# Patient Record
Sex: Female | Born: 1990 | Race: White | Hispanic: No | Marital: Single | Smoking: Former smoker
Health system: Southern US, Community
[De-identification: ages and names within clinical notes are randomized; demographics above are authoritative.]

## PROBLEM LIST (undated history)

## (undated) DIAGNOSIS — Z789 Other specified health status: Secondary | ICD-10-CM

## (undated) HISTORY — PX: NO PAST SURGERIES: SHX2092

---
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
## 2013-10-28 ENCOUNTER — Ambulatory Visit: Payer: Self-pay | Admitting: Obstetrics and Gynecology

## 2013-12-29 ENCOUNTER — Inpatient Hospital Stay: Payer: Self-pay | Admitting: Obstetrics and Gynecology

## 2013-12-29 LAB — APTT: ACTIVATED PTT: 26.7 s (ref 23.6–35.9)

## 2013-12-29 LAB — CBC WITH DIFFERENTIAL/PLATELET
BASOS ABS: 1 %
HCT: 32.7 % — AB (ref 35.0–47.0)
HGB: 11 g/dL — ABNORMAL LOW (ref 12.0–16.0)
Lymphocytes: 24 %
MCH: 29.6 pg (ref 26.0–34.0)
MCHC: 33.5 g/dL (ref 32.0–36.0)
MCV: 88 fL (ref 80–100)
MONOS PCT: 10 %
PLATELETS: 196 10*3/uL (ref 150–440)
RBC: 3.71 10*6/uL — AB (ref 3.80–5.20)
RDW: 13.3 % (ref 11.5–14.5)
Segmented Neutrophils: 65 %
WBC: 13.7 10*3/uL — ABNORMAL HIGH (ref 3.6–11.0)

## 2013-12-29 LAB — PROTIME-INR
INR: 1
Prothrombin Time: 13.3 secs (ref 11.5–14.7)

## 2013-12-30 LAB — CBC WITH DIFFERENTIAL/PLATELET
BASOS ABS: 0 10*3/uL (ref 0.0–0.1)
Basophil %: 0.2 %
EOS ABS: 0.2 10*3/uL (ref 0.0–0.7)
Eosinophil %: 1.4 %
HCT: 31.8 % — ABNORMAL LOW (ref 35.0–47.0)
HGB: 10.7 g/dL — ABNORMAL LOW (ref 12.0–16.0)
Lymphocyte #: 2.5 10*3/uL (ref 1.0–3.6)
Lymphocyte %: 14.8 %
MCH: 29.7 pg (ref 26.0–34.0)
MCHC: 33.6 g/dL (ref 32.0–36.0)
MCV: 88 fL (ref 80–100)
Monocyte #: 1.4 x10 3/mm — ABNORMAL HIGH (ref 0.2–0.9)
Monocyte %: 8.4 %
NEUTROS PCT: 75.2 %
Neutrophil #: 12.5 10*3/uL — ABNORMAL HIGH (ref 1.4–6.5)
Platelet: 201 10*3/uL (ref 150–440)
RBC: 3.6 10*6/uL — AB (ref 3.80–5.20)
RDW: 13.7 % (ref 11.5–14.5)
WBC: 16.6 10*3/uL — ABNORMAL HIGH (ref 3.6–11.0)

## 2014-01-01 LAB — HEMOGLOBIN: HGB: 11.4 g/dL — AB (ref 12.0–16.0)

## 2014-01-05 LAB — MISC AER/ANAEROBIC CULT.

## 2014-01-06 LAB — PATHOLOGY REPORT

## 2014-09-06 NOTE — H&P (Signed)
L&D Evaluation:  History:  HPI 24 yo G2P1 with IUFD at 29 weeks . CNM Jones diagnosed yesterday. + hydrops noted on u/s . BT A+. Declined first and second trimester testing . RPR and glucola nl yesterday . One prior term delivery with the same FOB   Patient's Medical History No Chronic Illness   Patient's Surgical History none   Medications Pre Natal Vitamins  Iron   Allergies NKDA   Social History none   Family History Non-Contributory   ROS:  ROS All systems were reviewed.  HEENT, CNS, GI, GU, Respiratory, CV, Renal and Musculoskeletal systems were found to be normal.   Exam:  Vital Signs stable   General no apparent distress   Mental Status clear   Chest clear   Heart normal sinus rhythm   Abdomen gravid, non-tender   Back no CVAT   Edema no edema   Pelvic 1 cm  by RN   Mebranes Intact   Skin dry   Impression:  Impression 29 week IUFD, etiology uncertain currently   Plan:  Plan IUFD work up , Induction start with cervidil , . Talk to couple about possibility for genetic w/up , autopsy . Un decided at this time . All questions answered . \IV demerol + zofran prn . Ambien tonight . CLE early in labor   Electronic Signatures: Schermerhorn, Ihor Austinhomas J (MD)  (Signed 02-Sep-15 23:17)  Authored: L&D Evaluation   Last Updated: 02-Sep-15 23:17 by Schermerhorn, Ihor Austinhomas J (MD)

## 2017-04-29 NOTE — L&D Delivery Note (Signed)
Delivery Note At 7:03 PM a viable and healthy female "Gabriela Pope" was delivered via Vaginal, Spontaneous (Presentation: LOA ).  APGAR:7 , 9 weight 3470g .   Placenta status: , .  Cord:  with the following complications: .  Cord pH: pending  Anesthesia:  epidural Episiotomy:  none Lacerations:  none Suture Repair: n/a Est. Blood Loss (mL):  200  Mom to postpartum.  Baby to Couplet care / Skin to Skin.  27yo [id number] at 39+0wks with hx of unexplained IUFD at 29wks presented for iol at term. She was given cytotex x2, a Cook cath and started on pitocin. AROM for meconium stained fluid, and she progressed on pitocin to fully dilated. She pushed twice over an intact perineum and delivered a fetal head that was limited by a tight nuchal cord that was also wrapped under the right axilla. The cord was doubly clamped and cut and the fetal shoulders were delivered with McRoberts. The baby was placed on maternal abdomen and stimulated for 30 sec, then transferred to the warmer and stimulated by SCN staff. The placenta delivered spontaneously intact with postpartum pitocin running. Bleeding was minimal. Placenta was intact. The fundus was firm. She tolerated the whole procedure well, watching her progress in a mirror. The baby was placed on the maternal chest for skin-to-skin and she plans on breastfeeding.  Gabriela Pope 12/04/2017, 7:25 PM

## 2017-05-14 ENCOUNTER — Other Ambulatory Visit: Payer: Self-pay | Admitting: Obstetrics and Gynecology

## 2017-05-14 DIAGNOSIS — Z369 Encounter for antenatal screening, unspecified: Secondary | ICD-10-CM

## 2017-06-02 ENCOUNTER — Ambulatory Visit (HOSPITAL_BASED_OUTPATIENT_CLINIC_OR_DEPARTMENT_OTHER)
Admission: RE | Admit: 2017-06-02 | Discharge: 2017-06-02 | Disposition: A | Discharge status: Home / Self Care | Payer: BLUE CROSS/BLUE SHIELD | Source: Ambulatory Visit | Attending: Obstetrics and Gynecology | Admitting: Obstetrics and Gynecology

## 2017-06-02 ENCOUNTER — Ambulatory Visit
Admission: RE | Admit: 2017-06-02 | Discharge: 2017-06-02 | Disposition: A | Discharge status: Home / Self Care | Payer: BLUE CROSS/BLUE SHIELD | Source: Ambulatory Visit | Attending: Obstetrics and Gynecology | Admitting: Obstetrics and Gynecology

## 2017-06-02 VITALS — BP 108/61 | HR 103 | Temp 98.2°F | Resp 18 | Ht 65.0 in | Wt 190.6 lb

## 2017-06-02 DIAGNOSIS — Z369 Encounter for antenatal screening, unspecified: Secondary | ICD-10-CM

## 2017-06-02 DIAGNOSIS — Z3A12 12 weeks gestation of pregnancy: Secondary | ICD-10-CM | POA: Diagnosis not present

## 2017-06-02 DIAGNOSIS — O09291 Supervision of pregnancy with other poor reproductive or obstetric history, first trimester: Secondary | ICD-10-CM | POA: Insufficient documentation

## 2017-06-02 HISTORY — DX: Other specified health status: Z78.9

## 2017-06-02 NOTE — Progress Notes (Addendum)
Referring physician:  [facility] Length of Consultation: 45 minutes   Gabriela Pope  was referred to [facility] [place] for genetic counseling to review prenatal screening and testing options.  We also reviewed her family history and pregnancy history which includes a 29 week fetal demise in 2015.  This note summarizes the information we discussed.    We first obtained a detailed pregnancy history and family history.  This is the third pregnancy for Gabriela Pope, the first with her current partner, Rosalyn Gess.  She has a healthy 37 year old daughter and he has a healthy 87 year old daughter.  Her second pregnancy resulted in a fetal demise at 29 weeks.  She reports that during that pregnancy, there was very little fetal movement.  A couple of weeks before the loss, she had a viral illness for several days which included a high heart rate for herself.  On the day she learned of the loss, she went to the doctor with no fetal movement and a high heart rate.  Ultrasound showed no fetal heart beat, polyhydramnios, skin edema and fluid in the chest and abdomen of the fetus. She delivered and declined an autopsy.  Chromosomal microarray was performed which showed normal female results.  No serology was performed to assess for infectious causes.  Placental pathology showed edematous villi, nucleated rbcs, 3 vessel cord and was negative for viral cytopathic effect, funicity and chorioamnionitis.  We discussed that hydrops can happen for many reasons including a chromosome condition, structural heart defect, genetic syndrome, infections and many other causes.  Because we have no clear etiology for that pregnancy, it is difficult to provide an accurate recurrence risk estimate.  Dr. Quin Hoop recommended initiation of antenatal testing at 28 weeks with monthly ultrasounds for growth and delivery by [facility].  In the current pregnancy, she reported no complications or exposures that would be expected  to increase the risk for birth defects.  In the family history, Rosalyn Gess reported that his sister has a son with severe cerebral palsy.  He reported that the child was born and experienced a seizure during which he was without oxygen.  He was transferred at that time to [facility] where the family was told he would have cerebral palsy due to the lack of oxygen.   If his condition is related to the lack of oxygen, then we would not expect other family members to be at increased risk for a similar outcome.  However, without knowing the cause for the seizures or the possibility of an underlying genetic condition, we cannot rule out an inherited component completely.  Alishba reported one paternal first cousin with Down syndrome.  He is currently in his 27s and lives in a group home.  We reviewed that Down syndrome is caused by an extra copy of the genetic instructions located on chromosome number 21.  These extra instructions result in the characteristic facial appearance, intellectual disabilities and an increased risk for some types of birth defects.  The majority (95%) of persons with Down syndrome have three freestanding copies of chromosome number 21, called trisomy 55.  This type of Down syndrome occurs as a sporadic condition and does not increase the chance for other family members to have Down syndrome.  Rarely, Down syndrome is caused by a rearrangement of the genetic instructions, where the extra chromosome number 21 is attached to another chromosome.  This type of chromosome rearrangement can be passed down through families and therefore may increase  the chance for Down syndrome in other family members.  We cannot determine which type of Down syndrome is present without documentation of chromosome studies in the affected family member.  If any additional information is obtained about this history, please do not hesitate to contact us.  The remainder of the family history was reported to be unremarkable for birth  defects, intellectual delays, recurrent pregnancy loss or known chromosome abnormalities.  We offered the following routine screening tests for this pregnancy:  First trimester screening, which includes nuchal translucency ultrasound screen and first trimester maternal serum marker screening.  The nuchal translucency has approximately an 80% detection rate for Down syndrome and can be positive for other chromosome abnormalities as well as congenital heart defects.  When combined with a maternal serum marker screening, the detection rate is up to 90% for Down syndrome and up to 97% for trisomy 18.     Maternal serum marker screening, a blood test that measures pregnancy proteins, can provide risk assessments for Down syndrome, trisomy 18, and open neural tube defects (spina bifida, anencephaly). Because it does not directly examine the fetus, it cannot positively diagnose or rule out these problems.  Targeted ultrasound uses high frequency sound waves to create an image of the developing fetus.  An ultrasound is often recommended as a routine means of evaluating the pregnancy.  It is also used to screen for fetal anatomy problems (for example, a heart defect) that might be suggestive of a chromosomal or other abnormality.   Should these screening tests indicate an increased concern, then the following additional testing options would be offered:  The chorionic villus sampling procedure is available for first trimester chromosome analysis.  This involves the withdrawal of a small amount of chorionic villi (tissue from the developing placenta).  Risk of pregnancy loss is estimated to be approximately 1 in 200 to 1 in 100 (0.5 to 1%).  There is approximately a 1% (1 in 100) chance that the CVS chromosome results will be unclear.  Chorionic villi cannot be tested for neural tube defects.     Amniocentesis involves the removal of a small amount of amniotic fluid from the sac surrounding the fetus with the  use of a thin needle inserted through the maternal abdomen and uterus.  Ultrasound guidance is used throughout the procedure.  Fetal cells from amniotic fluid are directly evaluated and > 99.5% of chromosome problems and > 98% of open neural tube defects can be detected. This procedure is generally performed after the 15th week of pregnancy.  The main risks to this procedure include complications leading to miscarriage in less than 1 in 200 cases (0.5%).  As another option for information if the pregnancy is suspected to be an an increased chance for certain chromosome conditions, we also reviewed the availability of cell free fetal DNA testing from maternal blood to determine whether or not the baby may have either Down syndrome, trisomy 513, or trisomy 4018.  This test utilizes a maternal blood sample and DNA sequencing technology to isolate circulating cell free fetal DNA from maternal plasma.  The fetal DNA can then be analyzed for DNA sequences that are derived from the three most common chromosomes involved in aneuploidy, chromosomes 13, 18, and 21.  If the overall amount of DNA is greater than the expected level for any of these chromosomes, aneuploidy is suspected.  While we do not consider it a replacement for invasive testing and karyotype analysis, a negative result from this testing  would be reassuring, though not a guarantee of a normal chromosome complement for the baby.  An abnormal result is certainly suggestive of an abnormal chromosome complement, though we would still recommend CVS or amniocentesis to confirm any findings from this testing.  Cystic Fibrosis and Spinal Muscular Atrophy (SMA) screening were also discussed with the patient. Both conditions are recessive, which means that both parents must be carriers in order to have a child with the disease.  Cystic fibrosis (CF) is one of the most common genetic conditions in persons of Caucasian ancestry.  This condition occurs in approximately 1  in 2,500 Caucasian persons and results in thickened secretions in the lungs, digestive, and reproductive systems.  For a baby to be at risk for having CF, both of the parents must be carriers for this condition.  Approximately 1 in 29 Caucasian persons is a carrier for CF.  Current carrier testing looks for the most common mutations in the gene for CF and can detect approximately 90% of carriers in the Caucasian population.  This means that the carrier screening can greatly reduce, but cannot eliminate, the chance for an individual to have a child with CF.  If an individual is found to be a carrier for CF, then carrier testing would be available for the partner. As part of [place]'s newborn screening profile, all babies born in the state of [place] will have a two-tier screening process.  Specimens are first tested to determine the concentration of immunoreactive trypsinogen (IRT).  The top 5% of specimens with the highest IRT values then undergo DNA testing using a panel of over 40 common CF mutations. SMA is a neurodegenerative disorder that leads to atrophy of skeletal muscle and overall weakness.  This condition is also more prevalent in the Caucasian population, with 1 in 40-1 in 60 persons being a carrier and 1 in 6,000-1 in 10,000 children being affected.  There are multiple forms of the disease, with some causing death in infancy to other forms with survival into adulthood.  The genetics of SMA is complex, but carrier screening can detect up to 95% of carriers in the Caucasian population.  Similar to CF, a negative result can greatly reduce, but cannot eliminate, the chance to have a child with SMA.  After consideration of the options, Ms. Rivere elected to proceed with an ultrasound only and to decline all other screening and testing options.  An ultrasound was performed at the time of the visit.  The gestational age was consistent with 12 weeks.  Fetal anatomy could not be assessed due to  early gestational age.  Please refer to the ultrasound report for details of that study.  Ms. Nichter will follow up as needed with her OB.  Ms. Austria was encouraged to call with questions or concerns.  We can be contacted at [phone].  Tests ordered: none   Cherly Anderson, MS, CGC  Katrina Stack, MS, CGC performed an integral service incident to the physician's initial service.  I was physically present in the clinical area and was immediately available to render assistance.   Donya Tomaro C Gordon Vandunk

## 2017-07-05 ENCOUNTER — Ambulatory Visit
Admission: EM | Admit: 2017-07-05 | Discharge: 2017-07-05 | Disposition: A | Discharge status: Home / Self Care | Payer: BLUE CROSS/BLUE SHIELD | Attending: Family Medicine | Admitting: Family Medicine

## 2017-07-05 ENCOUNTER — Other Ambulatory Visit: Payer: Self-pay

## 2017-07-05 DIAGNOSIS — R05 Cough: Secondary | ICD-10-CM | POA: Diagnosis not present

## 2017-07-05 DIAGNOSIS — R0981 Nasal congestion: Secondary | ICD-10-CM

## 2017-07-05 DIAGNOSIS — Z331 Pregnant state, incidental: Secondary | ICD-10-CM

## 2017-07-05 DIAGNOSIS — R059 Cough, unspecified: Secondary | ICD-10-CM

## 2017-07-05 NOTE — ED Triage Notes (Signed)
Pt reports she had flu like sx last week and most sx have resolved. Now with residual congestion in sinuses and chest. Blowing bloody secretions from nose. Pain 0/10

## 2017-07-05 NOTE — Discharge Instructions (Signed)
Rest.  Fluids.  Supportive care.  Take care  Dr. Adriana Simasook

## 2017-07-05 NOTE — ED Provider Notes (Signed)
[facility]    CSN: [id number] Arrival date & time: 07/05/17  1309  History   Chief Complaint Chief Complaint  Patient presents with  . Nasal Congestion   HPI  27 year old female presents with chest congestion and cough.  She also reports some nasal congestion as well.  Patient is currently pregnant.  She is approximately [redacted] weeks pregnant.  She has recently had the flu as has her daughter.  She states that her fever and other influenza symptoms have resolved.  She is now bothered predominantly by chest congestion and cough.  She states that she has been getting "winded" easily.  States that she wanted to get evaluated because of her ongoing symptoms.  No further fever.  No improvement in her symptoms with Sudafed.  No other interventions tried.  No other complaints or concerns at this time.  Past Medical History:  Diagnosis Date  . Medical history non-contributory    Patient Active Problem List   Diagnosis Date Noted  . First trimester screening   . Prior pregnancy with fetal demise and current pregnancy in first trimester    Surgical Hx - No past surgeries.  OB History    Gravida Para Term Preterm AB Living   3         1   SAB TAB Ectopic Multiple Live Births                 Home Medications    Prior to Admission medications   Medication Sig Start Date End Date Taking? Authorizing Provider  pseudoephedrine (SUDAFED) 120 MG 12 hr tablet Take 120 mg by mouth 2 (two) times daily.   Yes [provider]  flintstones complete (FLINTSTONES) 60 MG chewable tablet Chew 1 tablet by mouth daily.    [provider]   Family History Family History  Problem Relation Age of Onset  . Heart disease Father    Social History Social History   Tobacco Use  . Smoking status: Never Smoker  . Smokeless tobacco: Never Used  Substance Use Topics  . Alcohol use: No    Frequency: Never  . Drug use: No   Allergies   Patient has no known  allergies.   Review of Systems Review of Systems  Constitutional: Negative for fever.  HENT: Positive for congestion.   Respiratory: Positive for cough and shortness of breath.    Physical Exam Triage Vital Signs ED Triage Vitals  Enc Vitals Group     BP 07/05/17 1341 117/71     Pulse Rate 07/05/17 1341 91     Resp 07/05/17 1341 18     Temp 07/05/17 1341 98 F (36.7 C)     Temp Source 07/05/17 1341 Oral     SpO2 07/05/17 1341 100 %     Weight 07/05/17 1342 187 lb (84.8 kg)     Height 07/05/17 1342 5\' 5"  (1.651 m)     Head Circumference --      Peak Flow --      Pain Score --      Pain Loc --      Pain Edu? --      Excl. in GC? --    Updated Vital Signs BP 117/71 (BP Location: Left Arm)   Pulse 91   Temp 98 F (36.7 C) (Oral)   Resp 18   Ht 5\' 5"  (1.651 m)   Wt 187 lb (84.8 kg)   LMP 02/26/2017   SpO2 100%   BMI  31.12 kg/m     Physical Exam  Constitutional: She is oriented to person, place, and time. She appears well-developed. No distress.  HENT:  Head: Normocephalic and atraumatic.  Mouth/Throat: Oropharynx is clear and moist.  Eyes: Conjunctivae are normal.  Cardiovascular: Normal rate and regular rhythm.  Pulmonary/Chest: Effort normal and breath sounds normal. She has no wheezes. She has no rales.  Neurological: She is alert and oriented to person, place, and time.  Psychiatric: She has a normal mood and affect. Her behavior is normal.  Nursing note and vitals reviewed.  UC Treatments / Results  Labs (all labs ordered are listed, but only abnormal results are displayed) Labs Reviewed - No data to display  EKG  EKG Interpretation None       Radiology No results found.  Procedures Procedures (including critical care time)  Medications Ordered in UC Medications - No data to display   Initial Impression / Assessment and Plan / UC Course  I have reviewed the triage vital signs and the nursing notes.  Pertinent labs & imaging results that  were available during my care of the patient were reviewed by me and considered in my medical decision making (see chart for details).     28 year old female presents with respiratory symptoms following recent influenza.  Her exam is unremarkable.  I do not see an indication for further intervention at this time.  No antibiotic or other medication needed especially in the setting of pregnancy.  Advised supportive care with over-the-counter medication per her OB/GYN.  Final Clinical Impressions(s) / UC Diagnoses   Final diagnoses:  Cough    ED Discharge Orders    None     Controlled Substance Prescriptions [place] Controlled Substance Registry consulted? Not Applicable   Tommie Sams, DO 07/05/17 1442

## 2017-08-18 LAB — OB RESULTS CONSOLE ABO/RH: RH Type: POSITIVE

## 2017-08-18 LAB — OB RESULTS CONSOLE HIV ANTIBODY (ROUTINE TESTING)
HIV: NONREACTIVE
HIV: NONREACTIVE

## 2017-08-18 LAB — OB RESULTS CONSOLE ANTIBODY SCREEN: ANTIBODY SCREEN: NEGATIVE

## 2017-08-18 LAB — OB RESULTS CONSOLE RUBELLA ANTIBODY, IGM: RUBELLA: IMMUNE

## 2017-08-18 LAB — OB RESULTS CONSOLE RPR: RPR: NONREACTIVE

## 2017-11-20 ENCOUNTER — Other Ambulatory Visit: Payer: Self-pay | Admitting: Obstetrics and Gynecology

## 2017-11-20 ENCOUNTER — Observation Stay
Admission: EM | Admit: 2017-11-20 | Discharge: 2017-11-21 | Disposition: A | Discharge status: Home / Self Care | Payer: Medicaid Other | Attending: Obstetrics and Gynecology | Admitting: Obstetrics and Gynecology

## 2017-11-20 DIAGNOSIS — Z3A37 37 weeks gestation of pregnancy: Secondary | ICD-10-CM | POA: Insufficient documentation

## 2017-11-20 DIAGNOSIS — Z3483 Encounter for supervision of other normal pregnancy, third trimester: Principal | ICD-10-CM | POA: Insufficient documentation

## 2017-11-20 DIAGNOSIS — Z3490 Encounter for supervision of normal pregnancy, unspecified, unspecified trimester: Secondary | ICD-10-CM

## 2017-11-20 NOTE — Progress Notes (Unsigned)
Orders placed for IOL for 12/04/17. Myrtie Cruisearon W. Romey Mathieson,RN, MSN, CNM, FNP Certified Nurse Midwife [facility]

## 2017-11-20 NOTE — OB Triage Note (Signed)
Pt states she is here due to leaking small amounts of colorless, odorless fluid since 4AM this morning. Pt states the fluid is just enough to get her panties wet. Pt denies vaginal bleeding. Pt states she is having irregular ctxs. Pt states + fetal movement.

## 2017-11-21 ENCOUNTER — Encounter: Payer: Self-pay | Admitting: Certified Nurse Midwife

## 2017-11-21 DIAGNOSIS — Z3A37 37 weeks gestation of pregnancy: Secondary | ICD-10-CM | POA: Diagnosis not present

## 2017-11-21 DIAGNOSIS — Z3483 Encounter for supervision of other normal pregnancy, third trimester: Secondary | ICD-10-CM | POA: Diagnosis present

## 2017-11-21 DIAGNOSIS — Z3490 Encounter for supervision of normal pregnancy, unspecified, unspecified trimester: Secondary | ICD-10-CM

## 2017-11-21 LAB — ROM PLUS (ARMC ONLY): Rom Plus: NEGATIVE

## 2017-11-21 NOTE — Discharge Summary (Signed)
TRIAGE VISIT with NST  CC: Leaking fluid in pregnancy   Gabriela MadeiraKasey A Pope is a 27 y.o. G3P0. She is at 3728w2d gestation.  S: Resting comfortably. no CTX, no VB. Active fetal movement. Concerned about leaking   O:  BP 102/74 (BP Location: Right Arm)   Pulse (!) 113   Temp 98.4 F (36.9 C) (Oral)   Resp 18   Ht 5\' 5"  (1.651 m)   Wt 96.6 kg (213 lb)   LMP 02/26/2017   BMI 35.45 kg/m  Results for orders placed or performed during the hospital encounter of 11/20/17 (from the past 48 hour(s))  ROM Plus ([facility] only)   Collection Time: 11/21/17 12:26 AM  Result Value Ref Range   Rom Plus NEGATIVE      Gen: NAD, AAOx3      Abd: FNTTP      Ext: Non-tender, Nonedmeatous    FHT: 155, mod var, +accels, no decels TOCO: quiet SVE: Dilation: 1 Effacement (%): Thick Station: -3 Exam by:: Dione PloverA Jones, RN   A/P:  27 y.o. G3P0 at 2528w2d with R/o ROM.   Labor: not present.   R/o ROM: SSE negative x 3. ROM-plus negative. Normal amount of white discharge.   FWB: NST is Reassuring Cat 1 tracing.  D/c home stable, precautions reviewed, follow-up as scheduled.

## 2017-11-21 NOTE — OB Triage Note (Signed)
Discharge instructions provided and reviewed.  Follow up care discussed.  Pt verbalized understanding. 

## 2017-12-04 ENCOUNTER — Inpatient Hospital Stay
Admission: AD | Admit: 2017-12-04 | Discharge: 2017-12-06 | DRG: 807 | Disposition: A | Discharge status: Home / Self Care | Payer: Medicaid Other | Attending: Obstetrics and Gynecology | Admitting: Obstetrics and Gynecology

## 2017-12-04 ENCOUNTER — Other Ambulatory Visit: Payer: Self-pay

## 2017-12-04 ENCOUNTER — Inpatient Hospital Stay: Payer: Medicaid Other | Admitting: Anesthesiology

## 2017-12-04 DIAGNOSIS — Z87891 Personal history of nicotine dependence: Secondary | ICD-10-CM | POA: Diagnosis not present

## 2017-12-04 DIAGNOSIS — Z3A39 39 weeks gestation of pregnancy: Secondary | ICD-10-CM | POA: Diagnosis not present

## 2017-12-04 DIAGNOSIS — O26893 Other specified pregnancy related conditions, third trimester: Secondary | ICD-10-CM | POA: Diagnosis present

## 2017-12-04 LAB — COMPREHENSIVE METABOLIC PANEL
ALBUMIN: 3.1 g/dL — AB (ref 3.5–5.0)
ALK PHOS: 168 U/L — AB (ref 38–126)
ALT: 12 U/L (ref 0–44)
AST: 26 U/L (ref 15–41)
Anion gap: 10 (ref 5–15)
BILIRUBIN TOTAL: 0.5 mg/dL (ref 0.3–1.2)
BUN: 9 mg/dL (ref 6–20)
CALCIUM: 9 mg/dL (ref 8.9–10.3)
CO2: 20 mmol/L — ABNORMAL LOW (ref 22–32)
Chloride: 108 mmol/L (ref 98–111)
Creatinine, Ser: 0.74 mg/dL (ref 0.44–1.00)
GFR calc Af Amer: >60 mL/min (ref >60)
GFR calc non Af Amer: >60 mL/min (ref >60)
GLUCOSE: 109 mg/dL — AB (ref 70–99)
Potassium: 3.5 mmol/L (ref 3.5–5.1)
Sodium: 138 mmol/L (ref 135–145)
TOTAL PROTEIN: 6.3 g/dL — AB (ref 6.5–8.1)

## 2017-12-04 LAB — CBC
HEMATOCRIT: 34.2 % — AB (ref 35.0–47.0)
HEMOGLOBIN: 11.7 g/dL — AB (ref 12.0–16.0)
MCH: 29.8 pg (ref 26.0–34.0)
MCHC: 34.2 g/dL (ref 32.0–36.0)
MCV: 87 fL (ref 80.0–100.0)
Platelets: 219 10*3/uL (ref 150–440)
RBC: 3.93 MIL/uL (ref 3.80–5.20)
RDW: 13.8 % (ref 11.5–14.5)
WBC: 14.3 10*3/uL — AB (ref 3.6–11.0)

## 2017-12-04 MED ORDER — BENZOCAINE-MENTHOL 20-0.5 % EX AERO: 1.0000 "application " | INHALATION_SPRAY | CUTANEOUS | Status: DC | PRN

## 2017-12-04 MED ORDER — ONDANSETRON HCL 4 MG PO TABS: 4.0000 mg | ORAL_TABLET | ORAL | Status: DC | PRN

## 2017-12-04 MED ORDER — MISOPROSTOL 200 MCG PO TABS
ORAL_TABLET | ORAL | Status: AC
  Filled 2017-12-04: qty 4

## 2017-12-04 MED ORDER — BUTORPHANOL TARTRATE 2 MG/ML IJ SOLN: 1.0000 mg | INTRAMUSCULAR | Status: DC | PRN

## 2017-12-04 MED ORDER — TERBUTALINE SULFATE 1 MG/ML IJ SOLN: 0.2500 mg | Freq: Once | INTRAMUSCULAR | Status: DC | PRN

## 2017-12-04 MED ORDER — AMMONIA AROMATIC IN INHA
RESPIRATORY_TRACT | Status: AC
  Filled 2017-12-04: qty 10

## 2017-12-04 MED ORDER — FENTANYL 2.5 MCG/ML W/ROPIVACAINE 0.15% IN NS 100 ML EPIDURAL (ARMC)
EPIDURAL | Status: DC | PRN
  Administered 2017-12-04: 12 mL/h via EPIDURAL

## 2017-12-04 MED ORDER — IBUPROFEN 600 MG PO TABS
600.0000 mg | ORAL_TABLET | Freq: Four times a day (QID) | ORAL | Status: DC
  Administered 2017-12-04 – 2017-12-06 (×7): 600 mg via ORAL
  Filled 2017-12-04 (×7): qty 1

## 2017-12-04 MED ORDER — SODIUM CHLORIDE 0.9 % IV SOLN: 2.0000 g | Freq: Once | INTRAVENOUS | Status: DC

## 2017-12-04 MED ORDER — OXYTOCIN 40 UNITS IN LACTATED RINGERS INFUSION - SIMPLE MED
1.0000 m[IU]/min | INTRAVENOUS | Status: DC
  Administered 2017-12-04: 2 m[IU]/min via INTRAVENOUS

## 2017-12-04 MED ORDER — OXYTOCIN 10 UNIT/ML IJ SOLN
INTRAMUSCULAR | Status: AC
  Filled 2017-12-04: qty 2

## 2017-12-04 MED ORDER — LIDOCAINE HCL (PF) 1 % IJ SOLN
INTRAMUSCULAR | Status: DC | PRN
  Administered 2017-12-04: 3 mL via SUBCUTANEOUS

## 2017-12-04 MED ORDER — ONDANSETRON HCL 4 MG/2ML IJ SOLN: 4.0000 mg | Freq: Four times a day (QID) | INTRAMUSCULAR | Status: DC | PRN

## 2017-12-04 MED ORDER — WITCH HAZEL-GLYCERIN EX PADS: 1.0000 "application " | MEDICATED_PAD | CUTANEOUS | Status: DC | PRN

## 2017-12-04 MED ORDER — LACTATED RINGERS IV BOLUS
1000.0000 mL | Freq: Once | INTRAVENOUS | Status: AC
  Administered 2017-12-04: 500 mL via INTRAVENOUS

## 2017-12-04 MED ORDER — SODIUM CHLORIDE 0.9% FLUSH: 3.0000 mL | INTRAVENOUS | Status: DC | PRN

## 2017-12-04 MED ORDER — COCONUT OIL OIL
1.0000 "application " | TOPICAL_OIL | Status: DC | PRN
  Administered 2017-12-05: 1 via TOPICAL
  Filled 2017-12-04: qty 120

## 2017-12-04 MED ORDER — ONDANSETRON HCL 4 MG PO TABS: 4.0000 mg | ORAL_TABLET | Freq: Four times a day (QID) | ORAL | Status: DC | PRN

## 2017-12-04 MED ORDER — OXYCODONE HCL 5 MG PO TABS: 5.0000 mg | ORAL_TABLET | ORAL | Status: DC | PRN

## 2017-12-04 MED ORDER — BUPIVACAINE HCL (PF) 0.25 % IJ SOLN
INTRAMUSCULAR | Status: DC | PRN
  Administered 2017-12-04 (×2): 4 mL via EPIDURAL

## 2017-12-04 MED ORDER — FLEET ENEMA 7-19 GM/118ML RE ENEM: 1.0000 | ENEMA | Freq: Every day | RECTAL | Status: DC | PRN

## 2017-12-04 MED ORDER — DIPHENHYDRAMINE HCL 25 MG PO CAPS: 25.0000 mg | ORAL_CAPSULE | Freq: Four times a day (QID) | ORAL | Status: DC | PRN

## 2017-12-04 MED ORDER — MEASLES, MUMPS & RUBELLA VAC ~~LOC~~ INJ: 0.5000 mL | INJECTION | Freq: Once | SUBCUTANEOUS | Status: DC

## 2017-12-04 MED ORDER — ONDANSETRON HCL 4 MG/2ML IJ SOLN: 4.0000 mg | INTRAMUSCULAR | Status: DC | PRN

## 2017-12-04 MED ORDER — ZOLPIDEM TARTRATE 5 MG PO TABS
5.0000 mg | ORAL_TABLET | Freq: Every evening | ORAL | Status: DC | PRN
  Administered 2017-12-04: 5 mg via ORAL
  Filled 2017-12-04: qty 1

## 2017-12-04 MED ORDER — OXYTOCIN 40 UNITS IN LACTATED RINGERS INFUSION - SIMPLE MED
2.5000 [IU]/h | INTRAVENOUS | Status: DC
  Administered 2017-12-04: 2.5 [IU]/h via INTRAVENOUS

## 2017-12-04 MED ORDER — BISACODYL 10 MG RE SUPP: 10.0000 mg | Freq: Every day | RECTAL | Status: DC | PRN

## 2017-12-04 MED ORDER — MISOPROSTOL 25 MCG QUARTER TABLET
ORAL_TABLET | ORAL | Status: AC
  Administered 2017-12-04: 25 ug via VAGINAL
  Filled 2017-12-04: qty 2

## 2017-12-04 MED ORDER — ZOLPIDEM TARTRATE 5 MG PO TABS: 5.0000 mg | ORAL_TABLET | Freq: Every evening | ORAL | Status: DC | PRN

## 2017-12-04 MED ORDER — MISOPROSTOL 25 MCG QUARTER TABLET
25.0000 ug | ORAL_TABLET | ORAL | Status: DC
  Administered 2017-12-04: 25 ug via VAGINAL
  Filled 2017-12-04: qty 1

## 2017-12-04 MED ORDER — FENTANYL 2.5 MCG/ML W/ROPIVACAINE 0.15% IN NS 100 ML EPIDURAL (ARMC)
EPIDURAL | Status: AC
  Filled 2017-12-04: qty 100

## 2017-12-04 MED ORDER — IBUPROFEN 600 MG PO TABS
ORAL_TABLET | ORAL | Status: AC
  Filled 2017-12-04: qty 1

## 2017-12-04 MED ORDER — LACTATED RINGERS IV SOLN
INTRAVENOUS | Status: DC
  Administered 2017-12-04 (×2): via INTRAVENOUS

## 2017-12-04 MED ORDER — ACETAMINOPHEN 325 MG PO TABS
650.0000 mg | ORAL_TABLET | ORAL | Status: DC | PRN
  Administered 2017-12-05 – 2017-12-06 (×4): 650 mg via ORAL
  Filled 2017-12-04 (×4): qty 2

## 2017-12-04 MED ORDER — SENNOSIDES-DOCUSATE SODIUM 8.6-50 MG PO TABS
2.0000 | ORAL_TABLET | ORAL | Status: DC
  Administered 2017-12-05: 2 via ORAL
  Filled 2017-12-04 (×2): qty 2

## 2017-12-04 MED ORDER — SODIUM CHLORIDE 0.9 % IV SOLN: 250.0000 mL | INTRAVENOUS | Status: DC | PRN

## 2017-12-04 MED ORDER — OXYTOCIN 40 UNITS IN LACTATED RINGERS INFUSION - SIMPLE MED
INTRAVENOUS | Status: AC
  Administered 2017-12-04: 2 m[IU]/min via INTRAVENOUS
  Filled 2017-12-04: qty 1000

## 2017-12-04 MED ORDER — SODIUM CHLORIDE 0.9% FLUSH: 3.0000 mL | Freq: Two times a day (BID) | INTRAVENOUS | Status: DC

## 2017-12-04 MED ORDER — SIMETHICONE 80 MG PO CHEW
80.0000 mg | CHEWABLE_TABLET | ORAL | Status: DC | PRN
Start: 2017-12-04 — End: 2017-12-06

## 2017-12-04 MED ORDER — OXYTOCIN 40 UNITS IN LACTATED RINGERS INFUSION - SIMPLE MED
500.0000 m[IU]/h | INTRAVENOUS | Status: AC
  Administered 2017-12-04: 500 m[IU]/h via INTRAVENOUS

## 2017-12-04 MED ORDER — OXYTOCIN 40 UNITS IN LACTATED RINGERS INFUSION - SIMPLE MED: 2.5000 [IU]/h | INTRAVENOUS | Status: DC

## 2017-12-04 MED ORDER — MISOPROSTOL 25 MCG QUARTER TABLET
25.0000 ug | ORAL_TABLET | ORAL | Status: DC
  Administered 2017-12-04 (×2): 25 ug via ORAL
  Filled 2017-12-04: qty 1

## 2017-12-04 MED ORDER — LIDOCAINE HCL (PF) 1 % IJ SOLN
INTRAMUSCULAR | Status: AC
  Filled 2017-12-04: qty 30

## 2017-12-04 MED ORDER — DIBUCAINE 1 % RE OINT: 1.0000 "application " | TOPICAL_OINTMENT | RECTAL | Status: DC | PRN

## 2017-12-04 MED ORDER — TETANUS-DIPHTH-ACELL PERTUSSIS 5-2.5-18.5 LF-MCG/0.5 IM SUSP: 0.5000 mL | Freq: Once | INTRAMUSCULAR | Status: DC

## 2017-12-04 MED ORDER — MISOPROSTOL 25 MCG QUARTER TABLET
25.0000 ug | ORAL_TABLET | ORAL | Status: DC | PRN
  Administered 2017-12-04: 25 ug via VAGINAL

## 2017-12-04 MED ORDER — PRENATAL MULTIVITAMIN CH
1.0000 | ORAL_TABLET | Freq: Every day | ORAL | Status: DC
  Administered 2017-12-05: 1 via ORAL
  Filled 2017-12-04: qty 1

## 2017-12-04 NOTE — Progress Notes (Signed)
Gabriela Pope is a 27 y.o. G3P1101 at 455w1d  admitted for induction of labor due to prior IUFD.  Subjective: Comfortable with epidural  Objective: BP 115/86   Pulse 96   Temp 98.7 F (37.1 C) (Oral)   Resp 16   Ht 5\' 5"  (1.651 m)   Wt 96.6 kg   LMP 02/26/2017   SpO2 97%   BMI 35.45 kg/m  No intake/output data recorded. Total I/O In: 2270.8 [I.V.:1770.8; IV Piggyback:500] Out: -   FHT:  FHR: 155 bpm, variability: moderate,  accelerations:  Present,  decelerations:  Absent UC:   regular, every 3 minutes SVE:   Dilation: 5 Effacement (%): 50 Station: Ballotable Exam by:: Gabriela GarnetBeasley MD  Labs: Lab Results  Component Value Date   WBC 14.3 (H) 12/04/2017   HGB 11.7 (L) 12/04/2017   HCT 34.2 (L) 12/04/2017   MCV 87.0 12/04/2017   PLT 219 12/04/2017    Assessment / Plan: Pitocin at 2610mu/min IUPC place, mvus adequate at 200 AROM for meconium stained fluid Cat I strip currently Continue active management  Gabriela Pope 12/04/2017, 5:35 PM

## 2017-12-04 NOTE — H&P (Signed)
OB ADMISSION/ HISTORY & PHYSICAL:  Admission Date: 12/04/2017 12:49 AM  Admit Diagnosis: IOL  Gabriela Pope is a 27 y.o. female G3P1101 at 39+0 presenting for iol/for hx of IUFD/hydrops and delivery at 29wks.  Prenatal History: G3P1101   EDC : 12/10/2017, by Patient Reported  [facility] Prenatal course complicated by   Hx of fetal demise at 5929 weeks with last pregnancy (2015), no known cause, (chromosones were normal female), no autopsy done, NST at 28 weeks 2 x a week, Growth scans at 28, 32 and 36 weeek  Pt missed 7 weeks of care due to lapse of insurance and she now has Medicaid 11/17/17, Missed NST's 2 x week and monthly US for growth. From 11/17/17, back on the normal schedule of care  Elevated BP at 28w  09/17/17: 144/80  CMP, CBC, and P/C ratio ordered: WNL  Medical / Surgical History :  Past medical history:  Past Medical History:  Diagnosis Date  . Medical history non-contributory      Past surgical history:  Past Surgical History:  Procedure Laterality Date  . NO PAST SURGERIES      Family History:  Family History  Problem Relation Age of Onset  . Heart disease Father      Social History:  reports that she quit smoking about 10 months ago. She has never used smokeless tobacco. She reports that she does not drink alcohol or use drugs.   Allergies: Patient has no known allergies.    Current Medications at time of admission:  Prior to Admission medications   Medication Sig Start Date End Date Taking? Authorizing Provider  flintstones complete (FLINTSTONES) 60 MG chewable tablet Chew 1 tablet by mouth daily.    [provider]  pseudoephedrine (SUDAFED) 120 MG 12 hr tablet Take 120 mg by mouth 2 (two) times daily.    [provider]     Review of Systems: Active FM  Physical Exam:  VS: Blood pressure (!) 113/57, pulse 84, temperature 98.3 F (36.8 C), temperature source Oral, resp. rate 16, height 5\' 5"  (1.651 m), weight 96.6  kg, last menstrual period 02/26/2017.  General: alert and oriented, appears NAG, not anxious Heart: RRR Lungs: Clear lung fields Abdomen: Gravid, soft and non-tender, non-distended / uterus: NAD Extremities: edema  FHT: 155, moderate variability, +accels, no decels TOCO: irritability SVE:  Dilation: 1 / Effacement (%): 50 / Station: -3    Cephalic by leopolds  Prenatal Labs: Blood type/Rh A/Positive/-- (04/22 0000)  Antibody screen neg  Rubella Immune  Varicella Immune  RPR NR  HBsAg Neg  HIV NR  GC neg  Chlamydia neg  Genetic screening negative  1 hour GTT 103  3 hour GTT n/a  GBS negative   No results found.  Assessment: 39+[redacted] weeks gestation 1 stage of labor FHR category 2   Plan:  Admit for induction of labor Labs pending Epidural when desired Continuous fetal monitoring   1. Fetal Well being  - on admission, pt with Cat I strip, 150s baseline without decels, +accels. However, after 2 cytotec vaginally, Cat II strip with uterine irritability but no clear contractions but decels that last for 1-2 min and drop slowly and return slowly to baseline.  Placed Cook cath wti 80/6560ml- patient tolerated well. Detailed discussion with patient and family about labor expectations, her desire for epidural, and why I would recommend a C-section. We talked about Cat II strip  - we will proceed with mechanical cervical ripening and  low dose pitocin and monitor fetal status.   - Fetal Tracing: Cat II strip - Ultrasound:  reviewed, as above - Group B Streptococcus: neg - Presentation: vtx confirmed by Leopolds   2. Routine OB: - Prenatal labs reviewed, as above - Rh positive - A pos  3. Induction of Labor:  -  Contractions external toco in place -  Pelvis proven to 7#  4. Post Partum Planning: - Infant feeding: breast - Contraception: pills

## 2017-12-04 NOTE — Discharge Summary (Addendum)
Obstetrical Discharge Summary  Patient Name: Gabriela Pope DOB: [birth year] MRN: [id number]  Date of Admission: 12/04/2017 Date of Discharge: 12/06/2017  Primary OB: [facility]  Gestational Age at Delivery: [redacted]w[redacted]d   Antepartum complications:  Hx of fetal demise at 31 weeks with last pregnancy (2015), no known cause, (chromosones were normal female), no autopsy done, NST at 28 weeks 2 x a week, Growth scans at 32, 32 and 36 weeek  Pt missed 7 weeks of care due to lapse of insurance and she now has Medicaid 11/17/17, Missed NST's 2 x week and monthly [place] for growth. From 11/17/17, back on the normal schedule of care  Elevated BP at 28w  09/17/17: 144/80  CMP, CBC, and P/C ratio ordered: WNL  Admitting Diagnosis: IOL Secondary Diagnosis: Patient Active Problem List   Diagnosis Date Noted  . Indication for care in labor and delivery, antepartum 12/04/2017  . Pregnancy 11/21/2017  . First trimester screening   . Prior pregnancy with fetal demise and current pregnancy in first trimester     Augmentation: AROM, Pitocin, Cytotec and Foley Balloon Complications: None Intrapartum complications/course:   27yo G3P1101 at 39+0wks with hx of unexplained IUFD at 29wks presented for iol at term. She was given cytotex x2, a Cook cath and started on pitocin. AROM for meconium stained fluid, and she progressed on pitocin to fully dilated. She pushed twice over an intact perineum and delivered a fetal head that was limited by a tight nuchal cord that was also wrapped under the right axilla. The cord was doubly clamped and cut and the fetal shoulders were delivered with McRoberts. The baby was placed on maternal abdomen and stimulated for 30 sec, then transferred to the warmer and stimulated by SCN staff. The placenta delivered spontaneously intact with postpartum pitocin running. Bleeding was minimal. Placenta was intact. The fundus was firm. She tolerated the whole procedure well, watching her  progress in a mirror. The baby was placed on the maternal chest for skin-to-skin and she plans on breastfeeding.  Date of Delivery: 12/04/17 Delivered By: [name] Delivery Type: spontaneous vaginal delivery Anesthesia: epidural Placenta: Spontaneous Laceration: none Episiotomy: none Newborn Data: Live born female "Reece" Birth Weight: 7 lb 10.4 oz (3470 g) APGAR: 7, 9  Newborn Delivery   Birth date/time:  12/04/2017 19:03:00 Delivery type:  Vaginal, Spontaneous       Discharge Physical Exam:  BP (!) 135/91 (BP Location: Left Arm)   Pulse (!) 58   Temp 98.3 F (36.8 C) (Oral)   Resp 18   Ht 5\' 5"  (1.651 m)   Wt 96.6 kg   LMP 02/26/2017   SpO2 100%   Breastfeeding? Unknown   BMI 35.45 kg/m   General: NAD CV: RRR Pulm: CTABL, nl effort ABD: s/nd/nt, fundus firm and below the umbilicus Lochia: moderate Perineum: intact, minimal edema DVT Evaluation: LE non-ttp, no evidence of DVT on exam.  Hemoglobin  Date Value Ref Range Status  12/05/2017 11.4 (L) 12.0 - 16.0 g/dL Final   HGB  Date Value Ref Range Status  01/01/2014 11.4 (L) 12.0 - 16.0 g/dL Final   HCT  Date Value Ref Range Status  12/05/2017 31.7 (L) 35.0 - 47.0 % Final  12/30/2013 31.8 (L) 35.0 - 47.0 % Final    Post partum course: Patient had an uncomplicated postpartum course.  By time of discharge on PPD#1, her pain was controlled on oral pain medications; she had appropriate lochia and was ambulating, voiding without difficulty and tolerating  regular diet. She was deemed stable for discharge to home.    Postpartum Procedures: none Disposition: stable, discharge to home. Baby Feeding: breastmilk Baby Disposition: home with mom  Rh Immune globulin given: n/a Rubella vaccine given: n/a Tdap vaccine given in AP or PP setting: 09/17/17 Flu vaccine given in AP or PP setting: 08/18/17  Contraception: POPs  Prenatal Labs: Blood type/Rh A/Positive/-- (04/22 0000)  Antibody screen neg  Rubella  Immune  Varicella Immune  RPR NR  HBsAg Neg  HIV NR  GC neg  Chlamydia neg  Genetic screening negative  1 hour GTT 106  3 hour GTT n/a  GBS negatve     Plan:  Domingo MadeiraKasey A Leasure was discharged to home in good condition. [facility]  with delivering provider in 6 weeks   Discharge Medications: Allergies as of 12/06/2017   No Known Allergies     Medication List    STOP taking these medications   pseudoephedrine 120 MG 12 hr tablet Commonly known as:  SUDAFED     TAKE these medications   flintstones complete 60 MG chewable tablet Chew 1 tablet by mouth daily.   ibuprofen 600 MG tablet Commonly known as:  ADVIL,MOTRIN Take 1 tablet (600 mg total) by mouth every 6 (six) hours.       Follow-up Information    Christeen DouglasBeasley, Bethany, MD. Schedule an appointment as soon as possible for a visit in 6 week(s).   Specialty:  Obstetrics and Gynecology Why:  Please call to schedule your 6 week postpartum follow up appointent with Dr. Leretha DykesBeasley Contact information: [address]  [place] [id number] [phone]           Signed: Genia DelMargaret Marsa Matteo, CNM 12/06/2017 9:26 AM

## 2017-12-04 NOTE — Anesthesia Procedure Notes (Signed)
Epidural  Start time: 12/04/2017 3:55 PM End time: 12/04/2017 4:04 PM  Staffing Anesthesiologist: Naomie DeanKephart, William K, MD Resident/CRNA: Irving BurtonBachich, Lessly Stigler, CRNA Performed: resident/CRNA   Preanesthetic Checklist Completed: patient identified, site marked, surgical consent, pre-op evaluation, IV checked, risks and benefits discussed and monitors and equipment checked  Epidural Patient position: sitting Prep: ChloraPrep Patient monitoring: heart rate, continuous pulse ox and blood pressure Approach: midline Location: L3-L4 Injection technique: LOR air  Needle:  Needle type: Tuohy  Needle gauge: 17 G Needle length: 9 cm Needle insertion depth: 7.5 cm Catheter type: closed end flexible Catheter size: 19 Gauge Catheter at skin depth: 12 cm Test dose: negative and 1.5% lidocaine with Epi 1:200 K  Assessment Events: blood not aspirated, injection not painful, no injection resistance, negative IV test and no paresthesia  Additional Notes Reason for block:procedure for pain

## 2017-12-04 NOTE — Anesthesia Preprocedure Evaluation (Signed)
Anesthesia Evaluation  Patient identified by MRN, date of birth, ID band Patient awake    Reviewed: Allergy & Precautions, H&P , NPO status   History of Anesthesia Complications Negative for: history of anesthetic complications  Airway Mallampati: II   Neck ROM: full    Dental no notable dental hx.    Pulmonary former smoker,           Cardiovascular      Neuro/Psych    GI/Hepatic   Endo/Other    Renal/GU      Musculoskeletal   Abdominal   Peds  Hematology   Anesthesia Other Findings   Reproductive/Obstetrics (+) Pregnancy                             Anesthesia Physical Anesthesia Plan  ASA: II  Anesthesia Plan: Epidural   Post-op Pain Management:    Induction:   PONV Risk Score and Plan:   Airway Management Planned:   Additional Equipment:   Intra-op Plan:   Post-operative Plan:   Informed Consent: I have reviewed the patients History and Physical, chart, labs and discussed the procedure including the risks, benefits and alternatives for the proposed anesthesia with the patient or authorized representative who has indicated his/her understanding and acceptance.     Plan Discussed with: Anesthesiologist  Anesthesia Plan Comments:         Anesthesia Quick Evaluation

## 2017-12-05 LAB — CBC
HCT: 31.7 % — ABNORMAL LOW (ref 35.0–47.0)
Hemoglobin: 11.4 g/dL — ABNORMAL LOW (ref 12.0–16.0)
MCH: 30.9 pg (ref 26.0–34.0)
MCHC: 36.1 g/dL — ABNORMAL HIGH (ref 32.0–36.0)
MCV: 85.6 fL (ref 80.0–100.0)
Platelets: 205 10*3/uL (ref 150–440)
RBC: 3.71 MIL/uL — AB (ref 3.80–5.20)
RDW: 14 % (ref 11.5–14.5)
WBC: 17.1 10*3/uL — AB (ref 3.6–11.0)

## 2017-12-05 LAB — RPR: RPR: NONREACTIVE

## 2017-12-05 MED ORDER — IBUPROFEN 600 MG PO TABS: 600.0000 mg | ORAL_TABLET | Freq: Four times a day (QID) | ORAL | 0 refills | Status: AC

## 2017-12-05 NOTE — Progress Notes (Signed)
Post Partum Day 1 Subjective: Doing well, no complaints.  Tolerating regular diet, pain with PO meds, voiding and ambulating without difficulty.  No CP SOB Fever,Chills, N/V or leg pain; denies nipple or breast pain; no HA change of vision, RUQ/epigastric pain  Objective: BP 106/72 (BP Location: Left Arm)   Pulse 69   Temp 98 F (36.7 C) (Oral)   Resp 18   Ht 5\' 5"  (1.651 m)   Wt 96.6 kg   LMP 02/26/2017   SpO2 96%   Breastfeeding? Unknown   BMI 35.45 kg/m    Physical Exam:   General: NAD Breasts: soft/nontender CV: RRR Pulm: nl effort, CTABL Abdomen: soft, NT, BS x 4 Perineum: minimal edema, intact Lochia: moderate Uterine Fundus: fundus firm and 242fb below umbilicus DVT Evaluation: no cords, ttp LEs   Recent Labs    12/04/17 0226 12/05/17 0628  HGB 11.7* 11.4*  HCT 34.2* 31.7*  WBC 14.3* 17.1*  PLT 219 205    Assessment/Plan: 27 y.o. [id number] postpartum day # 1  - Continue routine PP care - Lactation consult.  - Discussed contraceptive options including implant, IUDs hormonal and non-hormonal, injection, pills/ring/patch, condoms, and NFP. Plans Pills - Immunization status: all Imms up to date    Disposition: Does not desire Dc home today.     Fadel Clason A, CNM  12/05/2017  8:37 AM

## 2017-12-05 NOTE — Progress Notes (Signed)
All discharge instructions given to patient and she voices understanding of all instructions given. She will make her own 6 wk f/u appt. Presciption is called into pharmacy by physician. Patient will be discharged in am due to birth certificate still needing to be completed.

## 2017-12-05 NOTE — Anesthesia Postprocedure Evaluation (Signed)
Anesthesia Post Note  Patient: Gabriela Pope  Procedure(s) Performed: AN AD HOC LABOR EPIDURAL  Patient location during evaluation: Mother Baby Anesthesia Type: Epidural Level of consciousness: awake, awake and alert and oriented Pain management: pain level controlled Vital Signs Assessment: post-procedure vital signs reviewed and stable Respiratory status: spontaneous breathing Cardiovascular status: blood pressure returned to baseline Postop Assessment: no headache and no backache Anesthetic complications: no     Last Vitals:  Vitals:   12/04/17 2331 12/05/17 0407  BP: 109/63 108/71  Pulse: 82 71  Resp: 18 20  Temp: 36.9 C 36.7 C  SpO2: 96%     Last Pain:  Vitals:   12/05/17 0407  TempSrc: Oral  PainSc: 0-No pain                 Sabiha Sura Lawerance CruelStarr

## 2017-12-05 NOTE — Lactation Note (Signed)
This note was copied from a baby's chart. Lactation Consultation Note  Patient Name: Gabriela Pope GUYQI'HToday's Date: 12/05/2017 Reason for consult: Initial assessment   Maternal Data Formula Feeding for Exclusion: No Has patient been taught Hand Expression?: Yes Does the patient have breastfeeding experience prior to this delivery?: No  Feeding Feeding Type: Breast Fed Length of feed: 25 min  LATCH Score Latch: Repeated attempts needed to sustain latch, nipple held in mouth throughout feeding, stimulation needed to elicit sucking reflex.  Audible Swallowing: A few with stimulation  Type of Nipple: Everted at rest and after stimulation  Comfort (Breast/Nipple): Soft / non-tender  Hold (Positioning): No assistance needed to correctly position infant at breast.  LATCH Score: 8  Interventions Interventions: Breast feeding basics reviewed  Lactation Tools Discussed/Used     Consult Status Consult Status: Follow-up Mom's nipple was creased after baby came off. LC spoke with mom about correcting baby's latch for next feeding and asked mom to call out for assistance. LC discussed clusterfeeding, how to position baby for latch, and how to contact LC's after d/c for support and outpatient consultation.    Gabriela Pope 12/05/2017, 12:02 PM

## 2017-12-06 NOTE — Progress Notes (Signed)
Discharge order received from doctor. Reviewed discharge instructions and prescriptions with patient and answered all questions. Follow up appointment instructions given. Patient verbalized understanding. ID bands checked. Patient discharged home with infant via wheelchair by nursing/auxillary.    Jessalyn Hinojosa Garner, RN  

## 2017-12-06 NOTE — Discharge Instructions (Signed)

## 2018-08-17 IMAGING — US US MFM OB COMPLETE +14 WKS
1 series · 13 of 28 positions shown · non-contrast
Comparison: none

PATIENT INFO:

Name:       JAONE DESPOTASHVILI                  Visit Date: 06/02/2017 [DATE]
PERFORMED BY:
SERVICE(S) PROVIDED:
INDICATIONS:
12 weeks gestation of pregnancy
First Trimester Screen
History of 29 week demise
FETAL EVALUATION:
Num Of Fetuses:     1
Cardiac Activity:   Present
Presentation:       Variable
Placenta:           Anterior
Amniotic Fluid
AFI FV:      Within normal limits
GESTATIONAL AGE:
LMP:           13w 5d        Date:  02/26/17                 EDD:   12/03/17
Best:          12w 5d     Det. By:  Early Ultrasound         EDD:   12/10/17
(04/30/17)
1ST TRIMESTER GENETIC SONOGRAM SCREENING:
CRL:              71  mm    G. Age:   13w 2d                 EDD:   12/06/17
ANATOMY:
Cranium:               Within Normal Limits   Abdominal Wall:         Within normal limits
for gestational age
Choroid Plexus:        Within normal limits   Upper Extremities:      Visualized
Stomach:               Seen                   Lower Extremities:      Visualized

[Series 1: us mfm ob complete +14 wks · 0.14mm/px · 13 of 59 slices shown]
[im 3/59]
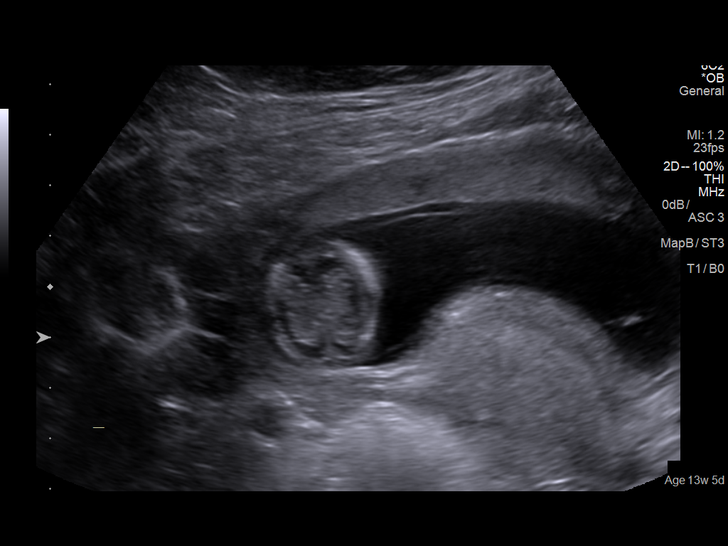
[im 7/59]
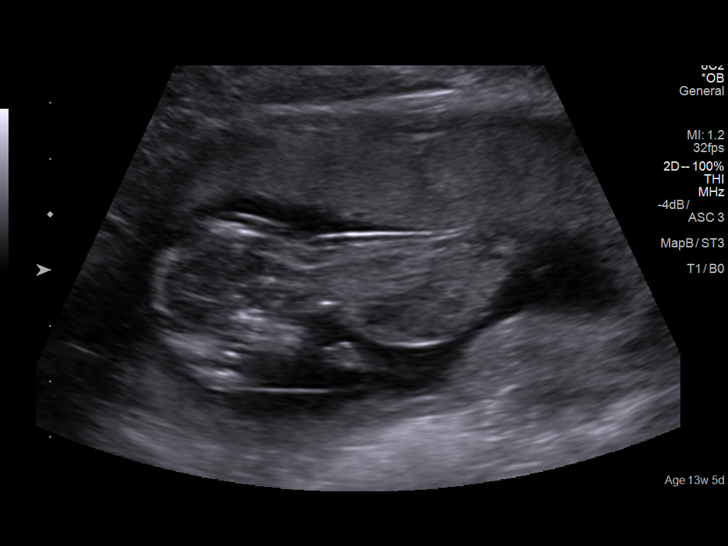
[im 11/59]
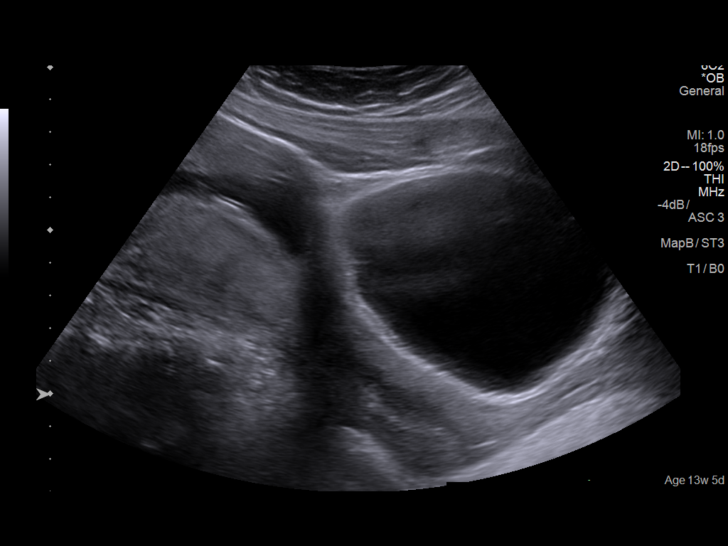
[im 16/59]
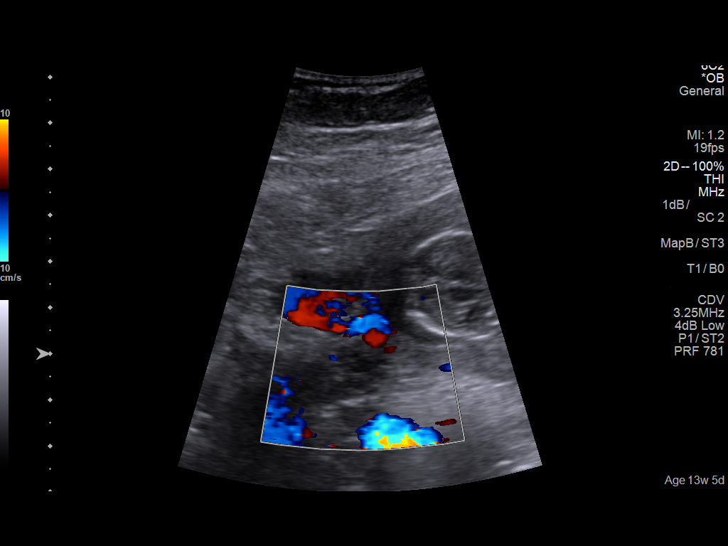
[im 20/59]
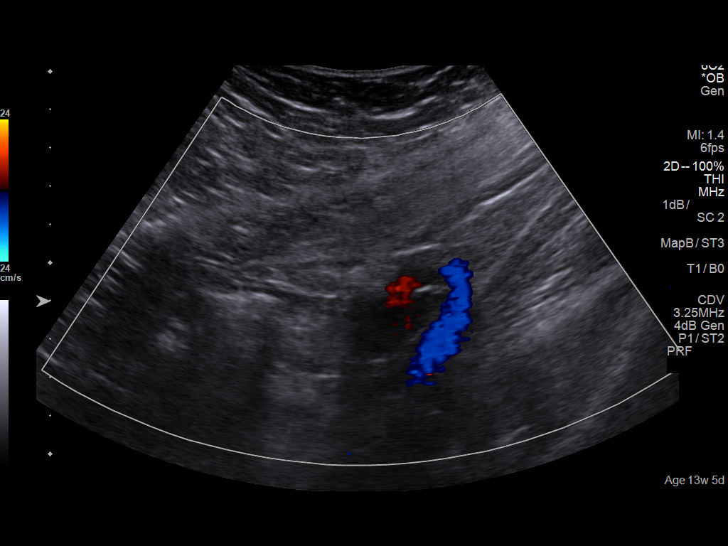
[im 24/59]
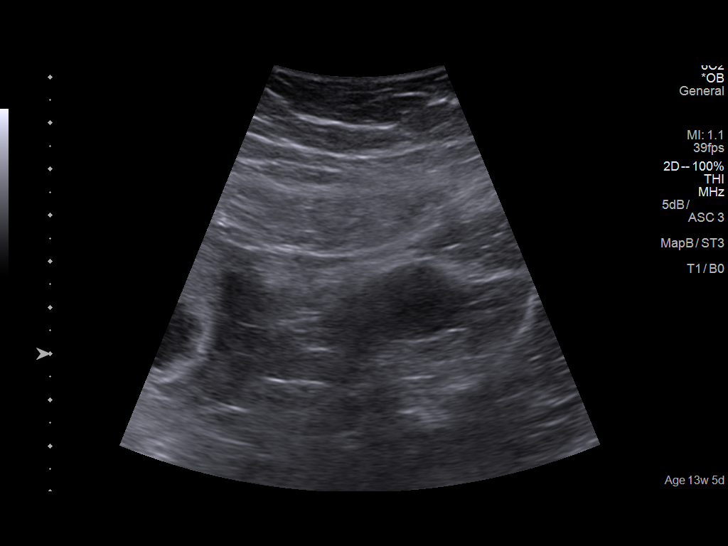
[im 31/59]
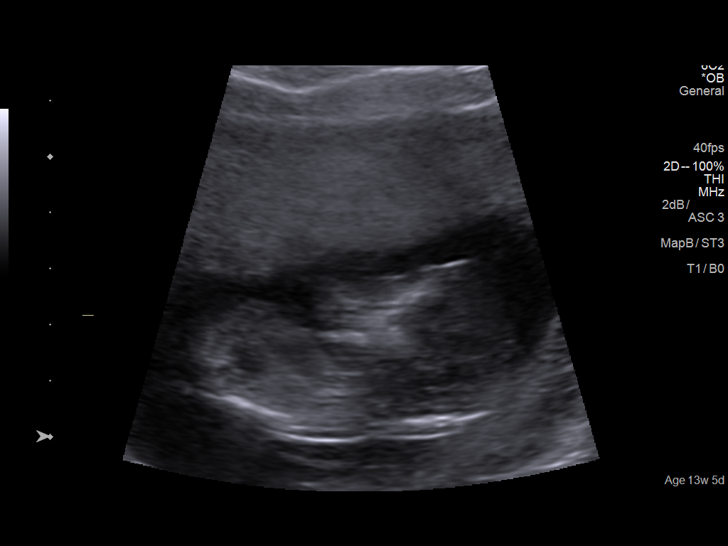
[im 35/59]
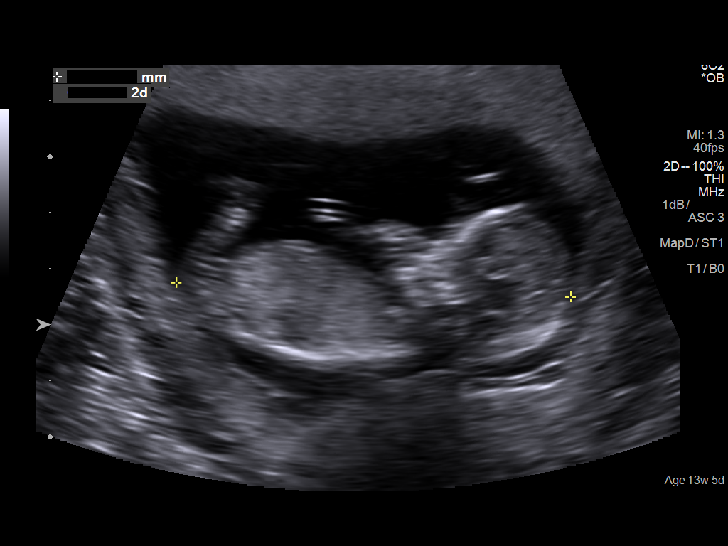
[im 39/59]
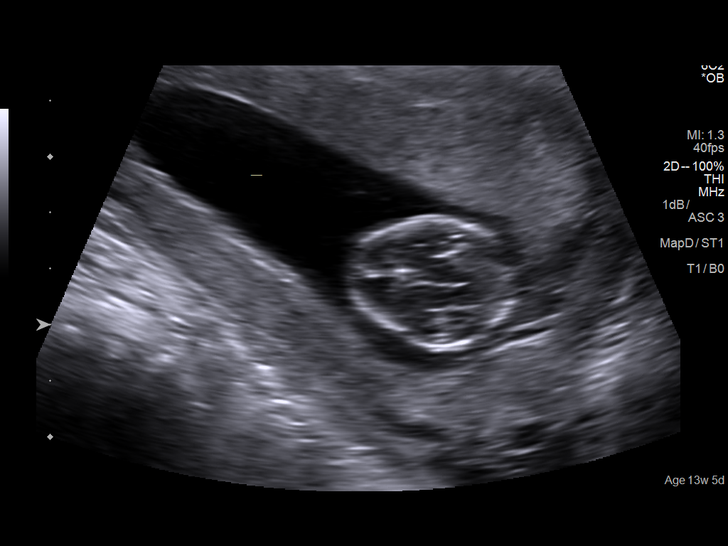
[im 43/59]
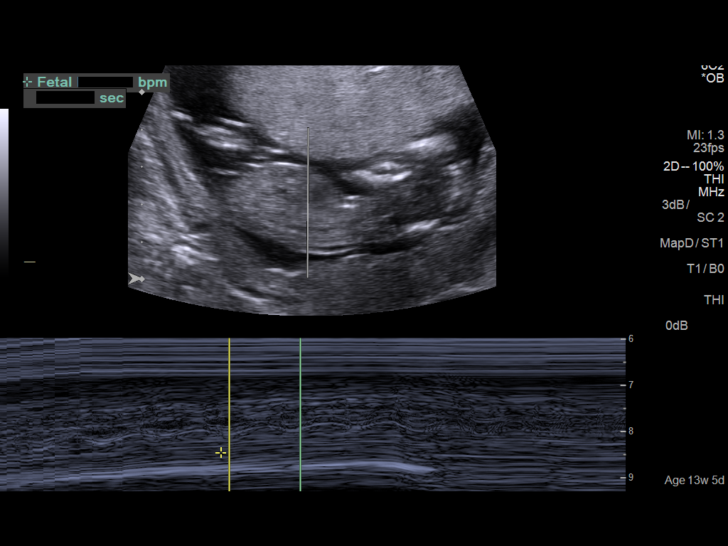
[im 48/59]
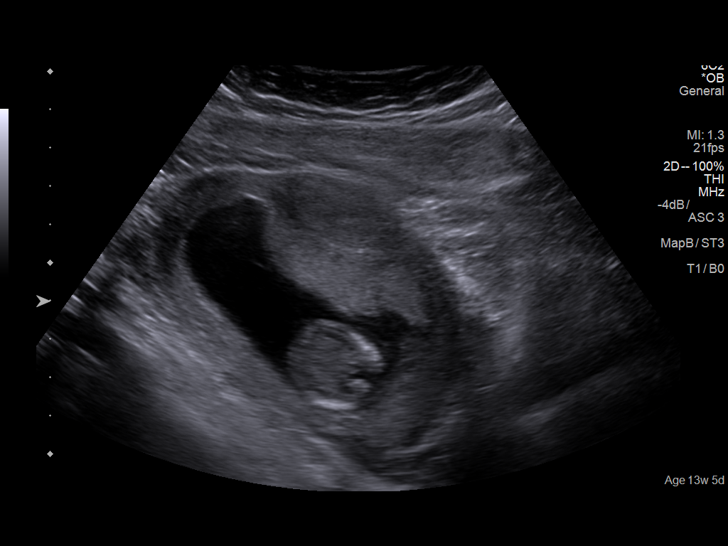
[im 52/59]
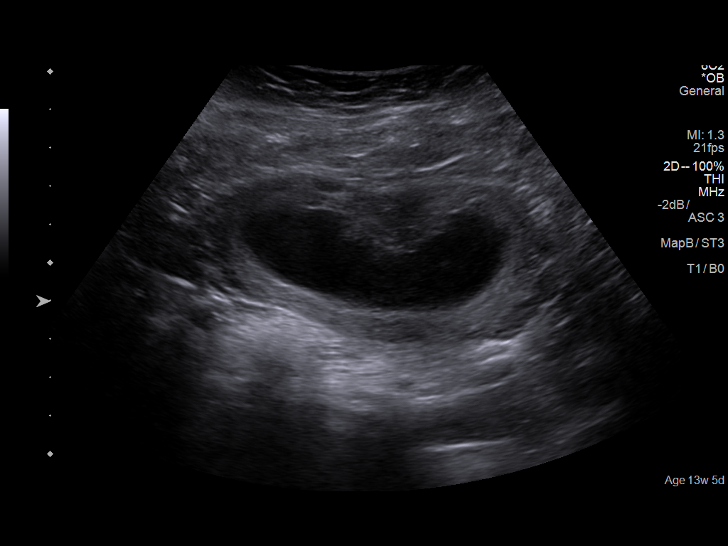
[im 56/59]
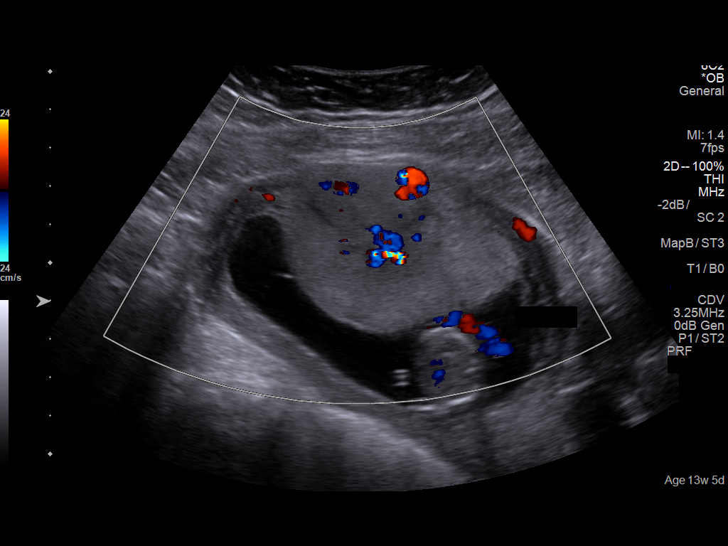

[13 of 28 positions shown; findings below may reference images not displayed]

IMPRESSION: Single intrauterine pregnancy with a best estimated
gestational age of 12 weeks 5 days.  Dating is based on
earliest available ultrasound performed at [REDACTED] on
04/30/2017; measurements were reported as 8 weeks 0 days.

Due to early gestational age, detailed images of fetal
anatomy were not evaluated.  Presence of stomach and 4
extremities is noted.  Abdominal cord insertion appears
grossly normal for early EGA.  Symmetric appearance of
choroid plexus is noted. Nuchal translucency area appears
unremarkable.

Maternal ovaries visualized and appear normal.

Of note, Ms. Ione most recent pregnancy in 5681 ended
in a 29 week demise of uncertain etiology.  She reported
decreased fetal movement and was found to have a fetal
demise on US; US report comments on polyhydramnios (AFI
of 32 cm), EFW 2922 grams (59th percentile), skin edema
around the chest, abdomen and extremities and fluid in the
fetal chest and abdomen. On admission, no serology was
performed and no cbc was documented (at least in [REDACTED]).  Ms. Aujla declined autopsy.  MIcroarray was
performed and demonstrated normal male.  Placental
pathology demonstrated a weight at >90th percentile for
gestational age, edematous villi with focal chronic villitis,
nucleated fetal rbcs, 3 vessel cord, and was negative for viral
cytopathic effect, funicitis and chorioamnionitis.

Given uncertain etiology, would offer initiation of antenatal
testing at 28 weeks with monthly ultrasounds for growth and
delivery by EDC.

Ms. Rudi had genetic counseling and declined aneuploidy
screening or testing.

## 2019-02-26 ENCOUNTER — Inpatient Hospital Stay
Admission: RE | Admit: 2019-02-26 | Discharge: 2019-02-26 | Disposition: A | Discharge status: Home / Self Care | Payer: Self-pay | Source: Ambulatory Visit

## 2019-02-26 ENCOUNTER — Telehealth: Payer: Self-pay

## 2019-02-26 ENCOUNTER — Other Ambulatory Visit: Payer: Self-pay

## 2019-03-02 ENCOUNTER — Other Ambulatory Visit: Payer: Self-pay

## 2019-03-02 ENCOUNTER — Ambulatory Visit
Admission: EM | Admit: 2019-03-02 | Discharge: 2019-03-02 | Disposition: A | Discharge status: Home / Self Care | Payer: Self-pay

## 2019-03-02 DIAGNOSIS — Z20828 Contact with and (suspected) exposure to other viral communicable diseases: Secondary | ICD-10-CM

## 2019-03-02 DIAGNOSIS — J029 Acute pharyngitis, unspecified: Secondary | ICD-10-CM

## 2019-03-02 DIAGNOSIS — Z20822 Contact with and (suspected) exposure to covid-19: Secondary | ICD-10-CM

## 2019-03-02 DIAGNOSIS — R519 Headache, unspecified: Secondary | ICD-10-CM

## 2019-03-02 DIAGNOSIS — J039 Acute tonsillitis, unspecified: Secondary | ICD-10-CM

## 2019-03-02 DIAGNOSIS — M791 Myalgia, unspecified site: Secondary | ICD-10-CM

## 2019-03-02 DIAGNOSIS — Z7189 Other specified counseling: Secondary | ICD-10-CM

## 2019-03-02 LAB — RAPID STREP SCREEN (MED CTR MEBANE ONLY): Streptococcus, Group A Screen (Direct): NEGATIVE

## 2019-03-02 MED ORDER — AMOXICILLIN 875 MG PO TABS: 875.0000 mg | ORAL_TABLET | Freq: Two times a day (BID) | ORAL | 0 refills | Status: AC

## 2019-03-02 MED ORDER — FLUCONAZOLE 150 MG PO TABS: ORAL_TABLET | ORAL | 0 refills | Status: AC

## 2019-03-02 NOTE — Discharge Instructions (Signed)
It was very nice seeing you today in clinic. Thank you for entrusting me with your care.   Rest and increase fluid intake. You have fluid behind your ears. Start daily allergy medication. Please utilize the medications that we discussed. Your prescriptions has been called in to your pharmacy.   You were tested for SARS-CoV-2 (novel coronavirus) today. Testing is performed by an outside lab ([facility]) and has variable turn around times ranging between 2-5 days. Current recommendations from the the CDC and  DHHS require that you remain out of work in order to quarantine at home until negative test results are have been received. In the event that your test results are positive, you will be contacted with further directives. These measures are being implemented out of an abundance of caution to prevent transmission and spread during the current SARS-CoV-2 pandemic.  Make arrangements to follow up with your regular doctor in 1 week for re-evaluation if not improving. If your symptoms/condition worsens, please seek follow up care either here or in the ER. Please remember, our [facility] providers are "right here with you" when you need [place].   Again, it was my pleasure to take care of you today. Thank you for choosing our clinic. I hope that you start to feel better quickly.   Honor Loh, MSN, APRN, FNP-C, CEN Advanced Practice Provider [facility]

## 2019-03-02 NOTE — ED Triage Notes (Signed)
Pt. States since last night she has had a severe sore throat, body aches, sinus pressure.

## 2019-03-03 NOTE — ED Provider Notes (Addendum)
Gabriela Pope, [place]   Name: Gabriela Pope DOB: 14-Sep-1990 MRN: [id number] CSN: [id number] PCP: System, Pcp Not In  Arrival date and time:  03/02/19 1454  Chief Complaint:  Sore Throat   NOTE: Prior to seeing the patient today, I have reviewed the triage nursing documentation and vital signs. Clinical staff has updated patient's PMH/PSHx, current medication list, and drug allergies/intolerances to ensure comprehensive history available to assist in medical decision making.   History:   HPI: Gabriela Pope is a 28 y.o. female who presents today with complaints of sore throat, a generalized headache, paranasal sinus tenderness, and diffuse myalgias that began with acute onset yesterday. She has been running low grade temperatures (Tmax 99.6). She denies any shortness of breath or dysphagia; able to handle oral secretions and thin liquids without difficulties. She denies that she has experienced any nausea, vomiting, diarrhea, or abdominal pain. She is eating and drinking well. Patient denies any perceived alterations to her sense of taste or smell.  Patient denies close contact with anyone known to be ill, however she is a Associate Professor, thus she notes that she cannot be fully confident in saying this. She has never been tested for SARS-CoV-2 (novel coronavirus) per her report. Despite her symptoms, patient has not taken any over the counter interventions to help improve/relieve her reported symptoms at home.   Past Medical History:  Diagnosis Date  . Medical history non-contributory     Past Surgical History:  Procedure Laterality Date  . NO PAST SURGERIES      Family History  Problem Relation Age of Onset  . Heart disease Father   . Diabetes Mother   . Hypertension Mother   . Hyperlipidemia Mother     Social History   Tobacco Use  . Smoking status: Former Smoker    Quit date: 01/04/2017    Years since quitting: 2.1  . Smokeless tobacco: Never Used  Substance Use Topics  . Alcohol  use: No    Frequency: Never  . Drug use: No    Patient Active Problem List   Diagnosis Date Noted  . Indication for care in labor and delivery, antepartum 12/04/2017  . Pregnancy 11/21/2017  . First trimester screening   . Prior pregnancy with fetal demise and current pregnancy in first trimester     Home Medications:    Current Meds  Medication Sig  . cetirizine (ZYRTEC ALLERGY) 10 MG tablet Take 10 mg by mouth daily.    Allergies:   Patient has no known allergies.  Review of Systems (ROS): Review of Systems  Constitutional: Positive for fever. Negative for fatigue.  HENT: Positive for sinus pressure and sore throat. Negative for congestion, ear pain, postnasal drip, rhinorrhea, sinus pain, sneezing and trouble swallowing.   Eyes: Negative for pain, discharge and redness.  Respiratory: Negative for cough, chest tightness and shortness of breath.   Cardiovascular: Negative for chest pain and palpitations.  Gastrointestinal: Negative for abdominal pain, diarrhea, nausea and vomiting.  Musculoskeletal: Positive for myalgias. Negative for arthralgias, back pain and neck pain.  Skin: Negative for color change, pallor and rash.  Neurological: Positive for headaches. Negative for dizziness, syncope and weakness.  Hematological: Negative for adenopathy.     Vital Signs: Today's Vitals   03/02/19 1510 03/02/19 1512 03/02/19 1551  BP: 126/80    Pulse: (!) 113    Temp: 98.6 F (37 C)    TempSrc: Oral    SpO2: 100%    Weight:  190 lb (86.2  kg)   PainSc:  7  7     Physical Exam: Physical Exam  Constitutional: She is oriented to person, place, and time and well-developed, well-nourished, and in no distress. No distress.  HENT:  Head: Normocephalic and atraumatic.  Right Ear: There is tenderness. Tympanic membrane is erythematous. Tympanic membrane is not bulging. A middle ear effusion (cloudy) is present.  Left Ear: Tympanic membrane normal.  Nose: Mucosal edema and sinus  tenderness (mild maxillary; R>L) present. No rhinorrhea.  Mouth/Throat: Uvula is midline and mucous membranes are normal. Posterior oropharyngeal erythema present.  Tonsils grade II with (+) white exudate.  Eyes: Pupils are equal, round, and reactive to light. EOM are normal.  Neck: Normal range of motion. Neck supple.  Cardiovascular: Regular rhythm, normal heart sounds and intact distal pulses. Tachycardia present. Exam reveals no gallop and no friction rub.  No murmur heard. Pulmonary/Chest: Effort normal. No respiratory distress. She has no decreased breath sounds. She has no wheezes. She has no rhonchi. She has no rales.  Abdominal: Soft. Normal appearance and bowel sounds are normal. She exhibits no distension. There is no abdominal tenderness.  Musculoskeletal: Normal range of motion.  Lymphadenopathy:       Head (right side): Submandibular and tonsillar adenopathy present.  Neurological: She is alert and oriented to person, place, and time. Gait normal.  Skin: Skin is warm and dry. No rash noted. She is not diaphoretic.  Psychiatric: Mood, memory, affect and judgment normal.  Nursing note and vitals reviewed.   Urgent Care Treatments / Results:   LABS: PLEASE NOTE: all labs that were ordered this encounter are listed, however only abnormal results are displayed. Labs Reviewed  RAPID STREP SCREEN ([facility] ONLY)  CULTURE, GROUP A STREP ([place])  NOVEL CORONAVIRUS, NAA (HOSP ORDER, SEND-OUT TO REF LAB; TAT 18-24 HRS)    EKG: -None  RADIOLOGY: No results found.  PROCEDURES: Procedures  MEDICATIONS RECEIVED THIS VISIT: Medications - No data to display  PERTINENT CLINICAL COURSE NOTES/UPDATES:   Initial Impression / Assessment and Plan / Urgent Care Course:  Pertinent labs & imaging results that were available during my care of the patient were personally reviewed by me and considered in my medical decision making (see lab/imaging section of note for values and  interpretations).  Gabriela Pope is a 28 y.o. female who presents to [facility] today with complaints of Sore Throat   Patient overall well appearing and in no acute distress today in clinic. Presenting symptoms (see HPI) and exam as documented above. She presents with symptoms associated with SARS-CoV-2 (novel coronavirus). Discussed typical symptom constellation. Reviewed potential for infection and need for testing. Patient amenable to being tested. SARS-CoV-2 swab collected by certified clinical staff. Discussed variable turn around times associated with testing, as swabs are being processed at [facility], and have been taking between 2-5 days to come back. She was advised to self quarantine, per [facility] DHHS guidelines, until negative results received.   Rapid streptococcal throat swab (-); reflex culture sent. Tonsils enlarged with (+) white exudate noted on exam. Discussed possibility of acute pharyngitis secondary to NGAS, which will be tested for with the throat culture. Will proceed with treatment of exudative tonsillitis using a 10 day course of amoxicillin. Discussed supportive care measures at home during acute phase of illness. Patient to rest as much as possible. She was encouraged to ensure adequate hydration (water and ORS) to prevent dehydration and electrolyte derangements. Patient may use APAP and/or IBU on  an as needed basis for pain/fever. Patient has has a history of vulvovaginal candidiasis in the past while on oral antimicrobial therapy. Will send in prophylactic fluconazole dose (150 mg x 1 - may repeat in 72 hours if still symptomatic) for patient to use should she develop symptoms.  Current clinical condition warrants patient being out of work in order to quarantine while waiting for testing results. She was provided with the appropriate documentation to provide to her place of employment that will allow for her to RTW on 03/04/2019 with no restrictions. RTW is contingent on her  SARS-CoV-2 test results being reviewed as negative.   Discussed follow up with primary care physician in 1 week for re-evaluation. I have reviewed the follow up and strict return precautions for any new or worsening symptoms. Patient is aware of symptoms that would be deemed urgent/emergent, and would thus require further evaluation either here or in the emergency department. At the time of discharge, she verbalized understanding and consent with the discharge plan as it was reviewed with her. All questions were fielded by provider and/or clinic staff prior to patient discharge.    Final Clinical Impressions / Urgent Care Diagnoses:   Final diagnoses:  Exudative tonsillitis  Encounter for laboratory testing for COVID-19 virus  Advice given about COVID-19 virus infection    New Prescriptions:  [place] Controlled Substance Registry consulted? Not Applicable  Meds ordered this encounter  Medications  . amoxicillin (AMOXIL) 875 MG tablet    Sig: Take 1 tablet (875 mg total) by mouth 2 (two) times daily for 10 days.    Dispense:  20 tablet    Refill:  0  . fluconazole (DIFLUCAN) 150 MG tablet    Sig: Take 1 tablet (150 mg) PO x 1 dose. May repeat 150 mg dose in 3 days if still symptomatic.    Dispense:  2 tablet    Refill:  0    Recommended Follow up Care:  Patient encouraged to follow up with the following provider within the specified time frame, or sooner as dictated by the severity of her symptoms. As always, she was instructed that for any urgent/emergent care needs, she should seek care either here or in the emergency department for more immediate evaluation.  Follow-up Information    PCP In 1 week.   Why: General reassessment of symptoms if not improving        NOTE: This note was prepared using Scientist, clinical (histocompatibility and immunogenetics)Dragon dictation software along with smaller Lobbyistphrase technology. Despite my best ability to proofread, there is the potential that transcriptional errors may still occur from this process,  and are completely unintentional.    Verlee MonteGray, Faith Branan E, NP 03/03/19 1947

## 2019-03-04 LAB — NOVEL CORONAVIRUS, NAA (HOSP ORDER, SEND-OUT TO REF LAB; TAT 18-24 HRS): SARS-CoV-2, NAA: NOT DETECTED

## 2019-03-05 LAB — CULTURE, GROUP A STREP (THRC)
